# Patient Record
Sex: Male | Born: 2000 | Race: Black or African American | Hispanic: No | Marital: Single
Health system: Southern US, Community
[De-identification: ages and names within clinical notes are randomized; demographics above are authoritative.]

## PROBLEM LIST (undated history)

## (undated) DIAGNOSIS — J45909 Unspecified asthma, uncomplicated: Secondary | ICD-10-CM

## (undated) HISTORY — PX: ADENOIDECTOMY: SUR15

---
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
## 2024-01-10 ENCOUNTER — Emergency Department (HOSPITAL_COMMUNITY)
Admission: EM | Admit: 2024-01-10 | Discharge: 2024-01-11 | Disposition: A | Discharge status: Home / Self Care | Payer: Managed Care, Other (non HMO) | Attending: Emergency Medicine | Admitting: Emergency Medicine

## 2024-01-10 ENCOUNTER — Encounter (HOSPITAL_COMMUNITY): Payer: Self-pay

## 2024-01-10 DIAGNOSIS — R1032 Left lower quadrant pain: Secondary | ICD-10-CM | POA: Diagnosis present

## 2024-01-10 DIAGNOSIS — N202 Calculus of kidney with calculus of ureter: Secondary | ICD-10-CM | POA: Diagnosis not present

## 2024-01-10 DIAGNOSIS — N200 Calculus of kidney: Secondary | ICD-10-CM

## 2024-01-10 LAB — URINALYSIS, ROUTINE W REFLEX MICROSCOPIC
Bilirubin Urine: NEGATIVE
Glucose, UA: NEGATIVE mg/dL
Ketones, ur: NEGATIVE mg/dL
Leukocytes,Ua: NEGATIVE
Nitrite: NEGATIVE
Protein, ur: 100 mg/dL — AB
RBC / HPF: >50 RBC/hpf (ref 0–5)
Specific Gravity, Urine: 1.016 (ref 1.005–1.030)
pH: 6 (ref 5.0–8.0)

## 2024-01-10 NOTE — ED Triage Notes (Signed)
Pt want to UC for L flank pain, was told he has a 8mm stone and continues to have pain, denies n/v

## 2024-01-11 ENCOUNTER — Other Ambulatory Visit: Payer: Self-pay

## 2024-01-11 ENCOUNTER — Emergency Department (HOSPITAL_COMMUNITY): Payer: Managed Care, Other (non HMO)

## 2024-01-11 LAB — COMPREHENSIVE METABOLIC PANEL
ALT: 12 U/L (ref 0–44)
AST: 21 U/L (ref 15–41)
Albumin: 4.1 g/dL (ref 3.5–5.0)
Alkaline Phosphatase: 38 U/L (ref 38–126)
Anion gap: 12 (ref 5–15)
BUN: 8 mg/dL (ref 6–20)
CO2: 24 mmol/L (ref 22–32)
Calcium: 9.4 mg/dL (ref 8.9–10.3)
Chloride: 102 mmol/L (ref 98–111)
Creatinine, Ser: 1.29 mg/dL — ABNORMAL HIGH (ref 0.61–1.24)
GFR, Estimated: >60 mL/min (ref >60)
Glucose, Bld: 113 mg/dL — ABNORMAL HIGH (ref 70–99)
Potassium: 3.7 mmol/L (ref 3.5–5.1)
Sodium: 138 mmol/L (ref 135–145)
Total Bilirubin: 1.3 mg/dL — ABNORMAL HIGH (ref 0.0–1.2)
Total Protein: 7.5 g/dL (ref 6.5–8.1)

## 2024-01-11 LAB — CBC WITH DIFFERENTIAL/PLATELET
Abs Immature Granulocytes: 0.02 10*3/uL (ref 0.00–0.07)
Basophils Absolute: 0 10*3/uL (ref 0.0–0.1)
Basophils Relative: 0 %
Eosinophils Absolute: 0.2 10*3/uL (ref 0.0–0.5)
Eosinophils Relative: 2 %
HCT: 42.5 % (ref 39.0–52.0)
Hemoglobin: 14 g/dL (ref 13.0–17.0)
Immature Granulocytes: 0 %
Lymphocytes Relative: 33 %
Lymphs Abs: 2.7 10*3/uL (ref 0.7–4.0)
MCH: 31 pg (ref 26.0–34.0)
MCHC: 32.9 g/dL (ref 30.0–36.0)
MCV: 94.2 fL (ref 80.0–100.0)
Monocytes Absolute: 0.7 10*3/uL (ref 0.1–1.0)
Monocytes Relative: 8 %
Neutro Abs: 4.7 10*3/uL (ref 1.7–7.7)
Neutrophils Relative %: 57 %
Platelets: 294 10*3/uL (ref 150–400)
RBC: 4.51 MIL/uL (ref 4.22–5.81)
RDW: 12.9 % (ref 11.5–15.5)
WBC: 8.2 10*3/uL (ref 4.0–10.5)
nRBC: 0 % (ref 0.0–0.2)

## 2024-01-11 MED ORDER — HYDROCODONE-ACETAMINOPHEN 5-325 MG PO TABS: 1.0000 | ORAL_TABLET | Freq: Four times a day (QID) | ORAL | 0 refills | Status: AC | PRN

## 2024-01-11 MED ORDER — ONDANSETRON 4 MG PO TBDP: 4.0000 mg | ORAL_TABLET | Freq: Three times a day (TID) | ORAL | 0 refills | Status: AC | PRN

## 2024-01-11 MED ORDER — TAMSULOSIN HCL 0.4 MG PO CAPS: 0.4000 mg | ORAL_CAPSULE | Freq: Every day | ORAL | 0 refills | Status: AC

## 2024-01-11 MED ORDER — NAPROXEN 500 MG PO TABS: 500.0000 mg | ORAL_TABLET | Freq: Two times a day (BID) | ORAL | 0 refills | Status: AC

## 2024-01-11 NOTE — Discharge Instructions (Addendum)
You were seen in the emergency room today for kidney stones.  If sent tamsulosin to your pharmacy, you can take this once every evening until you pass the stone.  You can strain your urine at home to look for the stone.  I would recommend Tylenol 4 times a day 1000 mg as well as naproxen twice a day.  Take the naproxen with food.  If you have breakthrough pain please take the Norco which is a narcotic medicine do not drive or operate heavy machinery while taking this medicine.  If sent Zofran to your pharmacy to take as needed for nausea.  Make sure you are staying well-hydrated with primarily water.  Follow-up with urologist, please call and schedule appointment for follow-up.  Return to emergency room if you have any new or worsening symptoms including fever, chills, no longer able to eat or drink.

## 2024-01-11 NOTE — ED Provider Notes (Signed)
[place] EMERGENCY DEPARTMENT AT [facility] Provider Note   CSN: [id number] Arrival date & time: 01/10/24  2254     History  No chief complaint on file.   Nathan Mathews is a 23 y.o. male patient was sent here from urgent care for an 8 mm left-sided kidney stone identified on x-ray of the abdomen.  Patient reports she has had approximately 1 day of left lower quadrant abdominal pain associated with nausea and vomiting.  Symptoms have been intermittent.  When they do come they are severe.  He is currently in no pain was given Toradol prior to arrival.  Denies any fevers or chills.   HPI     Home Medications Prior to Admission medications   Medication Sig Start Date End Date Taking? Authorizing Provider  cetirizine (ZYRTEC) 10 MG tablet Take 1 tablet (10 mg total) by mouth daily. For 3 days 04/12/16   Nathan Shay, MD  mupirocin ointment (BACTROBAN) 2 % Apply topically 3 (three) times daily. 04/15/13   Moreno-Coll, Adlih, MD  predniSONE (DELTASONE) 20 MG tablet Take 2 tablets (40 mg total) by mouth daily. For 2 more days 04/12/16   Nathan Shay, MD      Allergies    Egg-derived products    Review of Systems   Review of Systems  Gastrointestinal:  Positive for abdominal pain.    Physical Exam Updated Vital Signs BP 94/63 (BP Location: Left Arm)   Pulse 80   Temp 98.1 F (36.7 C) (Oral)   Resp 18   SpO2 100%  Physical Exam Vitals and nursing note reviewed.  Constitutional:      General: He is not in acute distress.    Appearance: He is not toxic-appearing.  HENT:     Head: Normocephalic and atraumatic.  Eyes:     General: No scleral icterus.    Conjunctiva/sclera: Conjunctivae normal.  Cardiovascular:     Rate and Rhythm: Normal rate and regular rhythm.     Pulses: Normal pulses.     Heart sounds: Normal heart sounds.  Pulmonary:     Effort: Pulmonary effort is normal. No respiratory distress.     Breath sounds: Normal breath sounds.  Abdominal:      General: Abdomen is flat. Bowel sounds are normal.     Palpations: Abdomen is soft.     Tenderness: There is no abdominal tenderness. There is no right CVA tenderness or left CVA tenderness.  Musculoskeletal:     Right lower leg: No edema.     Left lower leg: No edema.  Skin:    General: Skin is warm and dry.     Findings: No lesion.  Neurological:     General: No focal deficit present.     Mental Status: He is alert and oriented to person, place, and time. Mental status is at baseline.     ED Results / Procedures / Treatments   Labs (all labs ordered are listed, but only abnormal results are displayed) Labs Reviewed  URINALYSIS, ROUTINE W REFLEX MICROSCOPIC - Abnormal; Notable for the following components:      Result Value   APPearance HAZY (*)    Hgb urine dipstick LARGE (*)    Protein, ur 100 (*)    Bacteria, UA FEW (*)    All other components within normal limits  COMPREHENSIVE METABOLIC PANEL  CBC WITH DIFFERENTIAL/PLATELET    EKG None  Radiology CT Renal Stone Study Result Date: 01/11/2024 CLINICAL DATA:  Abdominal/flank pain.  Kidney stones suspected. EXAM: CT ABDOMEN AND PELVIS WITHOUT CONTRAST TECHNIQUE: Multidetector CT imaging of the abdomen and pelvis was performed following the standard protocol without IV contrast. RADIATION DOSE REDUCTION: This exam was performed according to the departmental dose-optimization program which includes automated exposure control, adjustment of the mA and/or kV according to patient size and/or use of iterative reconstruction technique. COMPARISON:  None Available. FINDINGS: Lower chest: No acute abnormality. Hepatobiliary: No focal liver abnormality is seen. No gallstones, gallbladder wall thickening, or biliary dilatation. Pancreas: Unremarkable. No pancreatic ductal dilatation or surrounding inflammatory changes. Spleen: Normal in size without focal abnormality. Adrenals/Urinary Tract: Normal adrenal glands. There are several stones  within the upper and lower pole of the right kidney. The largest is in the upper pole measuring 3 mm. There are several punctate stones within the inferior pole of the left kidney. There is asymmetric left-sided nephromegaly with mild perinephric haziness. Stone within the distal left ureter just before the UVJ measures 2.5 mm, image 46/6 and image 70/3. Bladder appears normal. Stomach/Bowel: Stomach is within normal limits. Appendix appears normal. No evidence of bowel wall thickening, distention, or inflammatory changes. Vascular/Lymphatic: No significant vascular findings are present. No enlarged abdominal or pelvic lymph nodes. Reproductive: Prostate is unremarkable. Other: No abdominal wall hernia or abnormality. No abdominopelvic ascites. Musculoskeletal: No acute or significant osseous findings. IMPRESSION: 1. There is a 2.5 mm stone within the distal left ureter just before the UVJ. There is resultant mild asymmetric left-sided nephromegaly with mild perinephric haziness. 2. Bilateral nephrolithiasis. Electronically Signed   By: Signa Kell M.D.   On: 01/11/2024 09:29    Procedures Procedures    Medications Ordered in ED Medications - No data to display  ED Course/ Medical Decision Making/ A&P                                 Medical Decision Making Amount and/or Complexity of Data Reviewed Labs: ordered. Radiology: ordered.  Risk Prescription drug management.   [name] 22 y.o. presented today for abd pain. Working DDx includes, but not limited to, gastroenteritis, colitis, SBO, appendicitis, cholecystitis, hepatobiliary pathology, gastritis, PUD, ACS, dissection, pancreatitis, nephrolithiasis, AAA, UTI, pyelonephritis, torsion   R/o DDx: These are considered less likely than current impression due to history of present illness, physical exam, labs/imaging findings.  Review of prior external notes: 01/11/24 UC visit   Pmhx: none    Imaging:   CT Renal Study: due to  favored nephrolithiasis over GI etiology for patient's abdominal pain shows 2.80mm stone   Problem List / ED Course / Critical interventions / Medication management  Patient reporting to emergency room with left sided abdominal pain associated with nausea and vomiting.  Patient was seen at urgent care and diagnosed with kidney stone.  I obtain basic labs here to rule out infection.  UA negative for nitrites and negative for leukocytes.  He does have a slight elevation in creatinine to 1.29 however do not have baseline.  No leukocytosis.  He is well-appearing hemodynamically stable.  No fever or chills.  Given that symptoms are under control and no signs of systemic illness feel he is appropriate for discharge and follow-up outpatient with urology.  Patient and family member at bedside agree Patient was given Toradol at urgent care prior to arrival to emergency room.  Patient is no longer expressing pain nor nausea.  Patient was p.o. challenge prior to discharge and passed.  Patients vitals assessed. Upon arrival patient is hemodynamically stable.  I have reviewed the patients home medicines and have made adjustments as needed   Plan:  F/u w/ PCP in 2-3d to ensure resolution of sx.  Patient was given return precautions. Patient stable for discharge at this time.  Patient educated on current sx/dx and verbalized understanding of plan. Return to ER w/ new or worsening sx.          Final Clinical Impression(s) / ED Diagnoses Final diagnoses:  Kidney stone    Rx / DC Orders ED Discharge Orders     None         Reinaldo Raddle 01/11/24 1436    Wynetta Fines, MD 01/11/24 1558

## 2024-07-24 ENCOUNTER — Ambulatory Visit (HOSPITAL_COMMUNITY): Admit: 2024-07-24 | Discharge: 2024-07-24 | Discharge status: Against Medical Advice

## 2024-07-24 NOTE — ED Notes (Signed)
 Presents for flu and Covid vaccines. INformed [facility] does not provide those vaccines. Verbalized understanding.
